# Patient Record
Sex: Female | Born: 1965 | Race: White | Hispanic: No | Marital: Married | Smoking: Current every day smoker
Health system: Southern US, Community
[De-identification: ages and names within clinical notes are randomized; demographics above are authoritative.]

## PROBLEM LIST (undated history)

## (undated) DIAGNOSIS — J449 Chronic obstructive pulmonary disease, unspecified: Secondary | ICD-10-CM

## (undated) DIAGNOSIS — M549 Dorsalgia, unspecified: Secondary | ICD-10-CM

## (undated) DIAGNOSIS — D3A090 Benign carcinoid tumor of the bronchus and lung: Secondary | ICD-10-CM

## (undated) DIAGNOSIS — R569 Unspecified convulsions: Secondary | ICD-10-CM

## (undated) DIAGNOSIS — G8929 Other chronic pain: Secondary | ICD-10-CM

## (undated) DIAGNOSIS — M797 Fibromyalgia: Secondary | ICD-10-CM

## (undated) HISTORY — PX: APPENDECTOMY: SHX54

## (undated) HISTORY — DX: Fibromyalgia: M79.7

## (undated) HISTORY — PX: OOPHORECTOMY: SHX86

## (undated) HISTORY — DX: Dorsalgia, unspecified: M54.9

## (undated) HISTORY — DX: Benign carcinoid tumor of the bronchus and lung: D3A.090

## (undated) HISTORY — DX: Chronic obstructive pulmonary disease, unspecified: J44.9

## (undated) HISTORY — DX: Unspecified convulsions: R56.9

## (undated) HISTORY — DX: Other chronic pain: G89.29

---

## 1997-09-09 ENCOUNTER — Ambulatory Visit (HOSPITAL_COMMUNITY): Admission: RE | Admit: 1997-09-09 | Discharge: 1997-09-09 | Payer: Self-pay | Admitting: Gastroenterology

## 1997-09-17 ENCOUNTER — Other Ambulatory Visit: Admission: RE | Admit: 1997-09-17 | Discharge: 1997-09-17 | Payer: Self-pay | Admitting: Gastroenterology

## 1997-09-22 ENCOUNTER — Ambulatory Visit (HOSPITAL_COMMUNITY): Admission: RE | Admit: 1997-09-22 | Discharge: 1997-09-22 | Payer: Self-pay | Admitting: Family Medicine

## 1997-10-12 ENCOUNTER — Ambulatory Visit (HOSPITAL_COMMUNITY): Admission: RE | Admit: 1997-10-12 | Discharge: 1997-10-12 | Payer: Self-pay | Admitting: Gastroenterology

## 1997-10-14 ENCOUNTER — Other Ambulatory Visit: Admission: RE | Admit: 1997-10-14 | Discharge: 1997-10-14 | Payer: Self-pay | Admitting: Gastroenterology

## 1997-10-22 ENCOUNTER — Ambulatory Visit (HOSPITAL_COMMUNITY): Admission: RE | Admit: 1997-10-22 | Discharge: 1997-10-22 | Payer: Self-pay | Admitting: Gastroenterology

## 1997-10-27 ENCOUNTER — Ambulatory Visit (HOSPITAL_COMMUNITY): Admission: RE | Admit: 1997-10-27 | Discharge: 1997-10-27 | Payer: Self-pay | Admitting: Gastroenterology

## 1997-10-29 ENCOUNTER — Ambulatory Visit (HOSPITAL_COMMUNITY): Admission: RE | Admit: 1997-10-29 | Discharge: 1997-10-29 | Payer: Self-pay | Admitting: Gastroenterology

## 1997-10-30 ENCOUNTER — Other Ambulatory Visit: Admission: RE | Admit: 1997-10-30 | Discharge: 1997-10-30 | Payer: Self-pay | Admitting: Gastroenterology

## 1997-11-05 ENCOUNTER — Encounter (HOSPITAL_COMMUNITY): Admission: RE | Admit: 1997-11-05 | Discharge: 1998-02-03 | Payer: Self-pay | Admitting: Gastroenterology

## 1997-11-20 ENCOUNTER — Ambulatory Visit (HOSPITAL_COMMUNITY): Admission: RE | Admit: 1997-11-20 | Discharge: 1997-11-20 | Payer: Self-pay | Admitting: Gastroenterology

## 1998-05-05 ENCOUNTER — Emergency Department (HOSPITAL_COMMUNITY): Admission: EM | Admit: 1998-05-05 | Discharge: 1998-05-05 | Payer: Self-pay | Admitting: Emergency Medicine

## 1998-05-06 ENCOUNTER — Encounter: Payer: Self-pay | Admitting: Emergency Medicine

## 1998-07-08 ENCOUNTER — Ambulatory Visit (HOSPITAL_COMMUNITY): Admission: RE | Admit: 1998-07-08 | Discharge: 1998-07-08 | Payer: Self-pay | Admitting: Orthopedic Surgery

## 1998-07-08 ENCOUNTER — Encounter: Payer: Self-pay | Admitting: Orthopedic Surgery

## 1998-07-26 ENCOUNTER — Encounter: Admission: RE | Admit: 1998-07-26 | Discharge: 1998-09-09 | Payer: Self-pay

## 2000-11-12 ENCOUNTER — Encounter: Payer: Self-pay | Admitting: Emergency Medicine

## 2000-11-12 ENCOUNTER — Emergency Department (HOSPITAL_COMMUNITY): Admission: EM | Admit: 2000-11-12 | Discharge: 2000-11-13 | Payer: Self-pay | Admitting: Emergency Medicine

## 2000-12-06 ENCOUNTER — Emergency Department (HOSPITAL_COMMUNITY): Admission: EM | Admit: 2000-12-06 | Discharge: 2000-12-06 | Payer: Self-pay | Admitting: Emergency Medicine

## 2001-03-27 ENCOUNTER — Encounter: Payer: Self-pay | Admitting: Emergency Medicine

## 2001-03-27 ENCOUNTER — Emergency Department (HOSPITAL_COMMUNITY): Admission: EM | Admit: 2001-03-27 | Discharge: 2001-03-27 | Payer: Self-pay | Admitting: Emergency Medicine

## 2001-09-09 ENCOUNTER — Emergency Department (HOSPITAL_COMMUNITY): Admission: EM | Admit: 2001-09-09 | Discharge: 2001-09-09 | Payer: Self-pay

## 2001-12-02 ENCOUNTER — Emergency Department (HOSPITAL_COMMUNITY): Admission: EM | Admit: 2001-12-02 | Discharge: 2001-12-02 | Payer: Self-pay | Admitting: *Deleted

## 2001-12-02 ENCOUNTER — Encounter: Payer: Self-pay | Admitting: *Deleted

## 2001-12-09 ENCOUNTER — Emergency Department (HOSPITAL_COMMUNITY): Admission: EM | Admit: 2001-12-09 | Discharge: 2001-12-09 | Payer: Self-pay | Admitting: Emergency Medicine

## 2002-03-31 ENCOUNTER — Emergency Department (HOSPITAL_COMMUNITY): Admission: EM | Admit: 2002-03-31 | Discharge: 2002-03-31 | Payer: Self-pay | Admitting: Emergency Medicine

## 2002-03-31 ENCOUNTER — Encounter: Payer: Self-pay | Admitting: Emergency Medicine

## 2002-04-09 ENCOUNTER — Emergency Department (HOSPITAL_COMMUNITY): Admission: EM | Admit: 2002-04-09 | Discharge: 2002-04-09 | Payer: Self-pay | Admitting: Emergency Medicine

## 2002-04-09 ENCOUNTER — Encounter: Payer: Self-pay | Admitting: Emergency Medicine

## 2002-04-16 ENCOUNTER — Emergency Department (HOSPITAL_COMMUNITY): Admission: EM | Admit: 2002-04-16 | Discharge: 2002-04-16 | Payer: Self-pay | Admitting: Emergency Medicine

## 2002-06-19 ENCOUNTER — Emergency Department (HOSPITAL_COMMUNITY): Admission: EM | Admit: 2002-06-19 | Discharge: 2002-06-19 | Payer: Self-pay | Admitting: Emergency Medicine

## 2002-06-24 ENCOUNTER — Emergency Department (HOSPITAL_COMMUNITY): Admission: EM | Admit: 2002-06-24 | Discharge: 2002-06-24 | Payer: Self-pay | Admitting: Emergency Medicine

## 2002-06-25 ENCOUNTER — Emergency Department (HOSPITAL_COMMUNITY): Admission: EM | Admit: 2002-06-25 | Discharge: 2002-06-25 | Payer: Self-pay | Admitting: Emergency Medicine

## 2002-06-29 ENCOUNTER — Emergency Department (HOSPITAL_COMMUNITY): Admission: EM | Admit: 2002-06-29 | Discharge: 2002-06-29 | Payer: Self-pay | Admitting: Emergency Medicine

## 2002-07-09 ENCOUNTER — Emergency Department (HOSPITAL_COMMUNITY): Admission: EM | Admit: 2002-07-09 | Discharge: 2002-07-09 | Payer: Self-pay | Admitting: Emergency Medicine

## 2002-07-09 ENCOUNTER — Encounter: Payer: Self-pay | Admitting: Emergency Medicine

## 2002-09-17 ENCOUNTER — Emergency Department (HOSPITAL_COMMUNITY): Admission: EM | Admit: 2002-09-17 | Discharge: 2002-09-17 | Payer: Self-pay | Admitting: Emergency Medicine

## 2002-11-04 ENCOUNTER — Encounter: Payer: Self-pay | Admitting: Emergency Medicine

## 2002-11-04 ENCOUNTER — Emergency Department (HOSPITAL_COMMUNITY): Admission: EM | Admit: 2002-11-04 | Discharge: 2002-11-04 | Payer: Self-pay | Admitting: Emergency Medicine

## 2002-11-08 ENCOUNTER — Emergency Department (HOSPITAL_COMMUNITY): Admission: EM | Admit: 2002-11-08 | Discharge: 2002-11-08 | Payer: Self-pay | Admitting: Emergency Medicine

## 2002-11-08 ENCOUNTER — Encounter: Payer: Self-pay | Admitting: Emergency Medicine

## 2002-12-01 ENCOUNTER — Emergency Department (HOSPITAL_COMMUNITY): Admission: EM | Admit: 2002-12-01 | Discharge: 2002-12-01 | Payer: Self-pay | Admitting: Emergency Medicine

## 2003-01-14 ENCOUNTER — Emergency Department (HOSPITAL_COMMUNITY): Admission: EM | Admit: 2003-01-14 | Discharge: 2003-01-14 | Payer: Self-pay | Admitting: Emergency Medicine

## 2003-01-17 ENCOUNTER — Emergency Department (HOSPITAL_COMMUNITY): Admission: AD | Admit: 2003-01-17 | Discharge: 2003-01-17 | Payer: Self-pay | Admitting: Family Medicine

## 2003-02-27 ENCOUNTER — Emergency Department (HOSPITAL_COMMUNITY): Admission: EM | Admit: 2003-02-27 | Discharge: 2003-02-27 | Payer: Self-pay | Admitting: Emergency Medicine

## 2003-03-09 ENCOUNTER — Emergency Department (HOSPITAL_COMMUNITY): Admission: EM | Admit: 2003-03-09 | Discharge: 2003-03-09 | Payer: Self-pay | Admitting: Emergency Medicine

## 2003-03-16 ENCOUNTER — Emergency Department (HOSPITAL_COMMUNITY): Admission: AD | Admit: 2003-03-16 | Discharge: 2003-03-16 | Payer: Self-pay | Admitting: Family Medicine

## 2003-04-16 ENCOUNTER — Emergency Department (HOSPITAL_COMMUNITY): Admission: EM | Admit: 2003-04-16 | Discharge: 2003-04-16 | Payer: Self-pay | Admitting: Emergency Medicine

## 2003-04-18 ENCOUNTER — Emergency Department (HOSPITAL_COMMUNITY): Admission: EM | Admit: 2003-04-18 | Discharge: 2003-04-18 | Payer: Self-pay | Admitting: Emergency Medicine

## 2003-05-11 ENCOUNTER — Emergency Department (HOSPITAL_COMMUNITY): Admission: AD | Admit: 2003-05-11 | Discharge: 2003-05-11 | Payer: Self-pay | Admitting: Family Medicine

## 2003-10-01 ENCOUNTER — Emergency Department (HOSPITAL_COMMUNITY): Admission: EM | Admit: 2003-10-01 | Discharge: 2003-10-01 | Payer: Self-pay | Admitting: Emergency Medicine

## 2003-12-02 ENCOUNTER — Emergency Department (HOSPITAL_COMMUNITY): Admission: EM | Admit: 2003-12-02 | Discharge: 2003-12-02 | Payer: Self-pay | Admitting: Family Medicine

## 2004-01-22 ENCOUNTER — Emergency Department (HOSPITAL_COMMUNITY): Admission: EM | Admit: 2004-01-22 | Discharge: 2004-01-22 | Payer: Self-pay | Admitting: Emergency Medicine

## 2004-02-21 ENCOUNTER — Emergency Department: Payer: Self-pay | Admitting: Unknown Physician Specialty

## 2004-06-21 ENCOUNTER — Emergency Department: Payer: Self-pay | Admitting: Emergency Medicine

## 2004-07-18 ENCOUNTER — Emergency Department: Payer: Self-pay | Admitting: Emergency Medicine

## 2004-11-26 ENCOUNTER — Emergency Department (HOSPITAL_COMMUNITY): Admission: EM | Admit: 2004-11-26 | Discharge: 2004-11-26 | Payer: Self-pay | Admitting: Emergency Medicine

## 2005-01-03 ENCOUNTER — Emergency Department (HOSPITAL_COMMUNITY): Admission: EM | Admit: 2005-01-03 | Discharge: 2005-01-03 | Payer: Self-pay | Admitting: Emergency Medicine

## 2005-01-05 ENCOUNTER — Emergency Department (HOSPITAL_COMMUNITY): Admission: EM | Admit: 2005-01-05 | Discharge: 2005-01-05 | Payer: Self-pay | Admitting: *Deleted

## 2005-01-08 ENCOUNTER — Emergency Department (HOSPITAL_COMMUNITY): Admission: EM | Admit: 2005-01-08 | Discharge: 2005-01-08 | Payer: Self-pay | Admitting: Family Medicine

## 2005-01-10 ENCOUNTER — Emergency Department (HOSPITAL_COMMUNITY): Admission: EM | Admit: 2005-01-10 | Discharge: 2005-01-10 | Payer: Self-pay | Admitting: Emergency Medicine

## 2005-01-17 ENCOUNTER — Emergency Department (HOSPITAL_COMMUNITY): Admission: EM | Admit: 2005-01-17 | Discharge: 2005-01-17 | Payer: Self-pay | Admitting: Emergency Medicine

## 2007-05-05 ENCOUNTER — Emergency Department (HOSPITAL_COMMUNITY): Admission: EM | Admit: 2007-05-05 | Discharge: 2007-05-05 | Payer: Self-pay | Admitting: Emergency Medicine

## 2007-06-20 ENCOUNTER — Emergency Department (HOSPITAL_COMMUNITY): Admission: EM | Admit: 2007-06-20 | Discharge: 2007-06-20 | Payer: Self-pay | Admitting: Emergency Medicine

## 2007-10-07 ENCOUNTER — Encounter (INDEPENDENT_AMBULATORY_CARE_PROVIDER_SITE_OTHER): Payer: Self-pay | Admitting: Family Medicine

## 2007-10-07 ENCOUNTER — Ambulatory Visit: Payer: Self-pay | Admitting: Internal Medicine

## 2007-10-07 LAB — CONVERTED CEMR LAB
ALT: 14 units/L (ref 0–35)
AST: 19 units/L (ref 0–37)
Albumin: 3.9 g/dL (ref 3.5–5.2)
Alkaline Phosphatase: 69 units/L (ref 39–117)
BUN: 13 mg/dL (ref 6–23)
Basophils Absolute: 0 10*3/uL (ref 0.0–0.1)
Basophils Relative: 0 % (ref 0–1)
CO2: 19 meq/L (ref 19–32)
Calcium: 8.8 mg/dL (ref 8.4–10.5)
Chloride: 105 meq/L (ref 96–112)
Creatinine, Ser: 0.66 mg/dL (ref 0.40–1.20)
Eosinophils Absolute: 0.3 10*3/uL (ref 0.0–0.7)
Eosinophils Relative: 3 % (ref 0–5)
Glucose, Bld: 128 mg/dL — ABNORMAL HIGH (ref 70–99)
HCT: 39.1 % (ref 36.0–46.0)
Hemoglobin: 12.7 g/dL (ref 12.0–15.0)
Lymphocytes Relative: 21 % (ref 12–46)
Lymphs Abs: 2.6 10*3/uL (ref 0.7–4.0)
MCHC: 32.5 g/dL (ref 30.0–36.0)
MCV: 94 fL (ref 78.0–100.0)
Monocytes Absolute: 1 10*3/uL (ref 0.1–1.0)
Monocytes Relative: 8 % (ref 3–12)
Neutro Abs: 8.2 10*3/uL — ABNORMAL HIGH (ref 1.7–7.7)
Neutrophils Relative %: 68 % (ref 43–77)
Platelets: 350 10*3/uL (ref 150–400)
Potassium: 3.8 meq/L (ref 3.5–5.3)
RBC: 4.16 M/uL (ref 3.87–5.11)
RDW: 13.6 % (ref 11.5–15.5)
Sodium: 135 meq/L (ref 135–145)
Total Bilirubin: 0.3 mg/dL (ref 0.3–1.2)
Total Protein: 7 g/dL (ref 6.0–8.3)
WBC: 12.1 10*3/uL — ABNORMAL HIGH (ref 4.0–10.5)

## 2007-10-08 ENCOUNTER — Ambulatory Visit: Payer: Self-pay | Admitting: *Deleted

## 2007-10-11 ENCOUNTER — Encounter (INDEPENDENT_AMBULATORY_CARE_PROVIDER_SITE_OTHER): Payer: Self-pay | Admitting: Family Medicine

## 2007-10-11 LAB — CONVERTED CEMR LAB: Hgb A1c MFr Bld: 5.8 % (ref 4.6–6.1)

## 2007-10-12 ENCOUNTER — Emergency Department (HOSPITAL_COMMUNITY): Admission: EM | Admit: 2007-10-12 | Discharge: 2007-10-12 | Payer: Self-pay | Admitting: Emergency Medicine

## 2007-10-17 ENCOUNTER — Ambulatory Visit: Payer: Self-pay | Admitting: Internal Medicine

## 2008-04-06 ENCOUNTER — Emergency Department (HOSPITAL_COMMUNITY): Admission: EM | Admit: 2008-04-06 | Discharge: 2008-04-07 | Payer: Self-pay | Admitting: Emergency Medicine

## 2009-02-23 ENCOUNTER — Ambulatory Visit: Payer: Self-pay | Admitting: Thoracic Surgery (Cardiothoracic Vascular Surgery)

## 2009-08-25 ENCOUNTER — Ambulatory Visit: Payer: Self-pay | Admitting: Thoracic Surgery (Cardiothoracic Vascular Surgery)

## 2009-09-14 ENCOUNTER — Ambulatory Visit (HOSPITAL_COMMUNITY): Admission: RE | Admit: 2009-09-14 | Discharge: 2009-09-14 | Payer: Self-pay | Admitting: Thoracic Surgery

## 2009-10-20 ENCOUNTER — Ambulatory Visit: Payer: Self-pay | Admitting: Thoracic Surgery (Cardiothoracic Vascular Surgery)

## 2010-02-07 ENCOUNTER — Ambulatory Visit: Payer: Self-pay | Admitting: Thoracic Surgery (Cardiothoracic Vascular Surgery)

## 2010-02-07 ENCOUNTER — Encounter
Admission: RE | Admit: 2010-02-07 | Discharge: 2010-02-07 | Payer: Self-pay | Admitting: Thoracic Surgery (Cardiothoracic Vascular Surgery)

## 2010-06-20 LAB — GLUCOSE, CAPILLARY: Glucose-Capillary: 98 mg/dL (ref 70–99)

## 2010-07-08 ENCOUNTER — Other Ambulatory Visit: Payer: Self-pay | Admitting: Thoracic Surgery (Cardiothoracic Vascular Surgery)

## 2010-07-08 DIAGNOSIS — R911 Solitary pulmonary nodule: Secondary | ICD-10-CM

## 2010-08-16 ENCOUNTER — Ambulatory Visit: Payer: Self-pay | Admitting: Thoracic Surgery (Cardiothoracic Vascular Surgery)

## 2010-08-16 ENCOUNTER — Ambulatory Visit
Admission: RE | Admit: 2010-08-16 | Discharge: 2010-08-16 | Disposition: A | Discharge status: Home / Self Care | Payer: Medicaid Other | Source: Ambulatory Visit | Attending: Thoracic Surgery (Cardiothoracic Vascular Surgery) | Admitting: Thoracic Surgery (Cardiothoracic Vascular Surgery)

## 2010-08-16 DIAGNOSIS — R911 Solitary pulmonary nodule: Secondary | ICD-10-CM

## 2010-08-16 NOTE — Assessment & Plan Note (Signed)
OFFICE VISIT   Amanda Clark, Amanda Clark  DOB:  05/22/1965                                        Aug 25, 2009  CHART #:  [id number]   REASON FOR VISIT:  Followup regarding right lower lobe lung nodule.   HISTORY OF PRESENT ILLNESS:  The patient is a 45 year old woman with  multiple medical problems including tobacco abuse and severe COPD.  She  has been followed for over 18 months now for a right lower lobe mass.  I  saw her in November 2010 for the first time.  She missed multiple  followup appointments since that time.  This nodule was being evaluated.  She had a needle biopsy, which showed inflammation consistent with  inflammatory pseudotumor.  A PET scan was done, which showed mild uptake  with SUV of 3 and was felt that this still could represent a malignancy.  She was referred to Dr. Lynetta Mare for consideration of surgical  resection.  She showed on the day of surgery intoxicated and was  discharged from his practice and then was referred to me.  She states  that she has missed multiple followup appointments because of financial  issues.  Her husband is a Designer, fashion/clothing and has not had much work.  She has  also been in and out of the hospital multiple times with pulmonary  issues and bronchitis.   PAST MEDICAL HISTORY:  Significant for:  1. Right lung mass.  2. Bipolar disorder.  3. COPD.  4. Fibromyalgia.  5. Seizures.  6. Mitral valve prolapse.  7. TMJ.  8. Chronic back pain.  9. Bronchitis.  10.Pneumonia.   PAST SURGICAL HISTORY:  Appendectomy, cesarean section, and  oophorectomy.   CURRENT MEDICATIONS:  1. Seroquel 600 mg nightly.  2. Depakote 500 mg tablets, 5 tablets nightly.  3. Klonopin 2-3 tablets daily p.r.n.  4. Azor.  5. Flexeril p.r.n.  6. Vicodin p.r.n.  7. Tramadol p.r.n.   ALLERGIES:  She has allergies to morphine, which causes itching and  steroids, which causes agitation.   FAMILY HISTORY:  Significant for premature cardiac  disease in both  mother and father.   SOCIAL HISTORY:  She is married.  She is accompanied by her husband.  She says she is unable to work, has been unable to get disability or  Medicaid.  She smokes about 3 cigarettes daily.  She says 1 in the  morning, 1 in the afternoon, and 1 in the evening before she goes to  bed.  She is denying any alcohol or illicit drug use.   REVIEW OF SYSTEMS:  Loss of appetite, chest pain, chest tightness,  shortness of breath when lying flat, palpitations, shortness of breath  with exertion, dizziness, syncopal episodes, depression, and  nervousness.  All other systems are negative.   PHYSICAL EXAMINATION:  General:  The patient is a disheveled 45 year old  female, in no acute distress.  She is very anxious.  Vital Signs:  Her  blood pressure is 148/99, pulse 101, respirations are 18, and oxygen  saturation is 96% on room air.  Neurological:  She is alert and oriented  x3 with no focal deficits.  HEENT:  Poor dentition.  She does smell of  tobacco smoke.  Neck:  She has no cervical adenopathy, but there is a  small palpable node in the  left supraclavicular region.  Lungs:  Diminished breath sounds bilaterally.  There is no wheezing at the  present time.  Cardiac:  Regular rate and rhythm.  Normal S1 and S2.  There are no rubs or murmurs.  Abdomen:  Soft and nontender.  Extremities:  Without clubbing, cyanosis, or edema.   LABORATORY DATA:  A chest x-ray from [facility] in March of this year  does not show the mass.  However, it is relatively low in the right  lower lobe and it could be obscured by the diaphragm.  She says that she  had a dobutamine echo done by Dr. Melina Modena in January, but we do not  have the report of that.  Pulmonary function testing from [facility] in  December 2010 showed an FEV1 of 1.06 was decreased to 0.93, post  bronchodilators, her FVC was 1.5, which is 47% predicted.  FEV1 to FVC  ratio was 72%.  MVV was 104.  Blood gas  showed a pH of 7.45, pCO2 of 36,  and pO2 of 70.   IMPRESSION:  The patient is a 45 year old woman with right lower lobe  mass, severe COPD, and a history of tobacco abuse.  A needle biopsy  showed inflammation.  This is mildly positive on PET, which could also  represent inflammation.  Her new finding today is that she has a left  supraclavicular node.  Given this findings, she needs a repeat PET/CT to  see if this node in her neck lights up there is any other signs of  adenopathy or increased metabolic activity.  It also give [place] some  information as to the size of the primary mass, which instantly really  is visible on the plain chest x-ray, but again could be obscured by the  diaphragm given this is relatively low in the right lower lobe.  She is  a very poor surgical candidate.  The recommendation as to surgical  biopsy would await the results of this PET/CT as well as obtain reports  from Dr. Thomasena Edis who she saw recently and Dr. Melina Modena who she saw last  week and who did a dobutamine echo on her earlier in the year.  We will  go ahead and order the PET/CT, and I will plan to see her back in 2  weeks to follow up on that result.  Hopefully, we will have the other  information by that time.   Salvatore Decent Dorris Fetch, M.D.  Electronically Signed   SCH/MEDQ  D:  08/25/2009  T:  08/26/2009  Job:  [id number]   cc:   Joycie Peek  Karyl Kinnier

## 2010-08-16 NOTE — Consult Note (Signed)
NEW PATIENT CONSULTATION   Amanda Clark, Amanda Clark  DOB:  October 14, 1965                                        February 23, 2009  CHART #:  [id number]   REASON FOR CONSULTATION:  Right lower lobe mass.   HISTORY OF PRESENT ILLNESS:  The patient is a 45 year old woman who a  year ago was found to have a right lower lobe mass.  She has a history  of tobacco abuse.  A needle biopsy was done at that time which showed  inflammation consistent with inflammatory pseudotumor.  She was referred  to Dr. Lynetta Mare for consideration for surgical resection given that  the mass was suspicious for malignancy based on the CT findings.  Apparently she went through workup, but missed multiple appointments  with him and then showed up on the day of surgery, intoxicated.  She was  advised to undergo substance abuse counseling.  She subsequently had a  head CT in March of this year, which apparently was done in [facility], which showed increased uptake in this lesion with a SUV of 3.  There also was some increased activity in the distal esophagus.   She had repeat CT scans in both July and September both of which showed  no change in the 2 cm diameter right lower lobe mass.  She now is  referred for consideration for surgical resection.   She states that she has had a cough with scant sputum, which has been  clear.  There is no hemoptysis.  She does have wheezing.  She says that  she also suffers from chest pain, which she describes as a tightness and  shortness of breath.  Two to three times a month she will have an  episode where she has significant chest pain, shortness of breath, and  will break out into a sweat.  She says she feels panic during these  episodes.  She has been diagnosed with mitral valve prolapse.  She also  says that at least weekly and sometimes more commonly she has dizziness  or blackouts.  She has not had any weight loss.  She is still smoking  half a pack  of cigarettes daily.   PAST MEDICAL HISTORY:  Significant for,  1. Right lung mass.  2. Bipolar disorder.  3. COPD.  4. Fibromyalgias.  5. Seizures.  6. Mitral valve prolapse.  7. TMJ.  8. Chronic back pain.   PAST SURGICAL HISTORY:  Appendectomy, cesarean section, and  oophorectomy.   CURRENT MEDICATIONS:  1. Quetiapine fumarate 200 mg at bedtime.  2. Clonazepam 1 mg p.o. t.i.d.  3. Lasix 20 mg daily.  4. Potassium daily.  5. Topamax 50 mg daily.  6. She also takes diphenhydramine, promethazine, propoxyphene, and      ibuprofen p.r.n.   ALLERGIES:  She has allergies to morphine, which causes nausea,  vomiting, itching, and steroids, which when she is on for long-term  cause agitation.   FAMILY HISTORY:  Significant for premature heart disease in both mother  and father.   SOCIAL HISTORY:  She is married.  She is disabled.  She is smoking half  a pack of cigarettes daily.  She denies alcohol and illicit drug use.   REVIEW OF SYSTEMS:  Patient medical history form is reviewed and is on  the chart.  She notes loss of appetite, chest pain, chest tightness,  shortness of breath when lying flat, shortness of breath with exertion,  palpitations, COPD, cough, ulcers, dizziness, blackouts, headaches.  She  does have seizure history, arthritis, joint pain, muscle pain,  depression, nervousness, recent change in eyesight, peripheral edema.  All other systems are negative.   PHYSICAL EXAMINATION:  General:  The patient is a 45 year old woman who  is poorly kempt.  General:  She has mildly disheveled appearance.  She  is in no acute distress.  Vital Signs:  Blood pressure 156/101, pulse  113, respirations of 20, oxygen saturation 95% on room air.  Neurologic:  She is alert and oriented and appropriate at the present time with no  focal deficits.  HEENT:  Unremarkable.  Neck:  There is no thyromegaly,  adenopathy, or bruits.  Cardiac:  Regular rate and rhythm.  S1 and S2.  There  is no murmur.  Lungs:  Mild congestion, but otherwise clear  bilaterally.  There is no wheezing.  Extremities:  Without clubbing or  cyanosis.  She has trace edema in her lower extremities.   CT scan from September is reviewed and compared to the scan from July,  there was no interval change in the 2-cm right lower lobe mass.  No  mediastinal or hilar adenopathy.   IMPRESSION:  The patient is a 45 year old woman with history of bipolar  disease and tobacco abuse who has been followed with a lung mass for a  little over a year now.  She was at one point scheduled to have surgery,  but there is some discrepancy between her history which states that she  showed up for surgery intoxicated and was referred for substance abuse  counseling versus what she is telling me today which is that she became  ill, went to a different hospital and then just did not want to re-  schedule at that time because of financial concerns.   Was seen primarily a year ago, but at that point said that this mass  needs to come out.  The question is now after a year with no change and  no evidence of spread as well as a needle biopsy, which shows  inflammation, whether this mass could be safely followed or would need  to be removed.  Discussed the issues related to that with her and her  father-in-law who is present.  They understand that the needle biopsy is  not 100% accurate, there could be a relatively small focus of cancer  within this inflammatory mass.  This needle biopsy might have missed, so  cancer cannot be ruled out strictly based on needle biopsy.  I do think  the one thing in the favor that this will be more benign would be that  it really has not changed significantly in size, but again if there was  relatively small component of cancer it could be growing with the  increase in the overall size of the mass.  I offered her the option of  surgical resection versus continued following, but did encourage   surgical resection, which she says she does wish to proceed with.   However, there are multiple issues that need to be addressed first of  all her most recent CT scan is 10 weeks ago that needs to be repeated.  Secondly, she needs preoperative cardiology clearance given her frequent  episodes of chest pain, shortness of breath, and diaphoresis.  This may  all be due to  panic attacks or mitral valve prolapse, but she does have  a family history of coronary disease and I do not think it can be ruled  out based on her history.  We will try to set her up for a cardiology  evaluation done in [place].  She also needs pulmonary function testing  in room air blood gas.  We will go ahead and schedule those.  Finally,  it is absolutely vital that she quit smoking prior to surgery.  There is  no reason particularly after a 38-month delay to rush in to surgery  while she is still smoking half a pack a day, which would increase her  risk for perioperative complications.  I discussed these issues in  detail with her and told her that we had to take care of all four of  these issues prior to surgical consideration.  We will reschedule her  for a followup visit in 2 weeks and we will have some of that  accomplished by then, although we will need to have her stop smoking for  a longer period of time before surgery.   Salvatore Decent Dorris Fetch, M.D.  Electronically Signed   SCH/MEDQ  D:  02/23/2009  T:  02/24/2009  Job:  [id number]   cc:   Joycie Peek, MD  Karyl Kinnier

## 2010-08-16 NOTE — Assessment & Plan Note (Signed)
OFFICE VISIT   BREELEY, BISCHOF  DOB:  1965/12/11                                        February 07, 2010  CHART #:  [id number]   REASON FOR VISIT:  Follow up of right lower lobe nodule.   HISTORY:  The patient is a 45 year old woman who has a right lower lobe  mass.  This was initially found back in 2009.  She saw Dr. Joycie Peek at [place].  A needle biopsy showed inflammation.  A PET scan  showed a 2-cm mass with an SUV of 3 given concern that the yield from  the needle biopsy is not 100%.  She had been referred to Dr. Lynetta Mare.  She was scheduled for surgery but did not show up on multiple  occasions and she was released from their practice.  I have been seeing  her since November 2010.  I initially recommended surgery, but she  needed cardiology clearance.  She also was continuing to smoke.  She  missed multiple followup appointments and subsequently I saw her back in  May.  At that time, her pulmonary function testing was dramatically  impaired with an FEV-1 of 1.06 which decreased to 0.93 bronchodilators.  FVC was 47% of predicted.  We did a PET-CT at that time and it showed  similar findings to their previous PET-CT, the nodule was not increased  in size.  The SUV was actually slightly lower, probably within the range  of error, but at that time she was continuing to smoke, was very  disheveled.  She was having increasing wheezing, cough, sputum  production, and shortness of breath and was really not a surgical  candidate at all at that point in time.  By that time, it had also been  21 months since the nodule had been originally found.  I recommended to  her that we wait and see if she could stop smoking and then repeat her  scans at this time, which is now 2 years from her original discovery of  the lung nodule.  She returns today.  She says she has quit smoking.  She of note has continued to have a persistent cough.  She is on Spiriva  and also uses albuterol as a rescue inhaler.  She says that her  breathing has been fully improved since I saw her in July; however, she  still says that she will get up and wash a couple of dishes and have to  sit down and rest because of shortness of breath.   CURRENT MEDICATIONS:  1. Seroquel at bedtime.  2. Lasix 20 mg daily.  3. Spiriva inhaler 18 mcg daily.  4. Depakote 2500 mg at bedtime.  5. Klonopin 2-3 tablets daily p.r.n.  6. Albuterol p.r.n.  7. Azor.   ALLERGIES:  She has allergies to morphine and steroids.   PAST MEDICAL HISTORY:  Unchanged.   FAMILY AND SOCIAL HISTORY:  Unchanged except for discontinuance of  smoking.   REVIEW OF SYSTEMS:  See HPI.   PHYSICAL EXAMINATION:  The patient appears to be in somewhat better  shape than she was during her last visit.  She is a 45 year old woman in  no acute distress.  Blood pressure is 148/93, pulse 100, respirations  are 20, her oxygen saturation is 96% on room air.  Her lungs have  diminished breath sounds bilaterally.  There are faint expiratory  wheezes bilaterally.  There is no cervical or supraclavicular  adenopathy.  Cardiac exam has a regular rate and rhythm.  Normal S1 and  S2 with no rubs or murmurs.   LABORATORY DATA:  CT scan was performed that shows a 15- x 18-mm nodule  at the right base.  This is unchanged from her CT scan which was done in  conjunction with a PET scan and also is no larger than this nodule was 2  years ago when originally found.   IMPRESSION:  Ms. Printz is a 45 year old woman with a history of tobacco  abuse and a right lower lung nodule.  This has been biopsied about a  year ago and showed inflammation but no malignancy.  At one point, we  are going to try to resect this anyway just to eliminate the possibility  that there was a cancer with an inflammatory reaction around it;  however, we were unable to ever get her in good enough shape to tolerate  a surgical resection.  At this  point now, it has been 2 years and this  nodule really is unchanged.  She has had 2 PET scans about a year apart  which showed no difference, so at this point I would not recommend  surgical intervention given that she is still very significant risk with  marginal pulmonary status.  I recommended to her that we would repeat a  CT scan in 6 months.  If the nodule were to show growth in that period  of time, then repeat pulmonary function testing and consideration for  surgery would be appropriate.   Salvatore Decent Dorris Fetch, M.D.  Electronically Signed   SCH/MEDQ  D:  02/07/2010  T:  02/08/2010  Job:  [id number]   cc:   Joycie Peek  Karyl Kinnier

## 2010-08-16 NOTE — Letter (Signed)
February 23, 2009   Reine Just, MD  [address]  [place], [place]  [address]   Re:  Amanda Clark, Amanda Clark               DOB:  July 16, 1965   Dear Dr. Thomasena Edis:   Thank you very much for sending the patient for evaluation.  As you  know, she is a somewhat noncompliant patient who has had a right lung  mass which was first found in 2009.  She at one point apparently was  scheduled with surgery with Dr. Para March, but the procedure had to be  cancelled and then she never rescheduled.  She has a roughly 2-cm mass  based on her most recent scan which was in early September of this year  about 10 weeks ago.  There is no evidence of mediastinal or hilar  adenopathy.  She has had a needle biopsy done about a year ago which  showed inflammation consistent with inflammatory pseudotumor.   I had a long discussion with the patient.  This may well in fact just be  an inflammatory lesion but I did discuss with her the possibility that  there could be a relatively small underlying cancer with an inflammatory  process and that could not be completely ruled out by needle biopsy. And  I recommended to her that we proceed with surgical resection; however,  there are several issues that need to be addressed prior to that.   I feel like we should repeat her CT scan as it has been approximately 10  weeks since her most recent one.  We will go ahead and schedule that.   I am going to have her see a cardiologist because she has frequent  episodes of chest pain and shortness of breath.  This may be related to  anxiety or mitral valve prolapse, but she does have a family history of  coronary disease.  I am also going to schedule her for pulmonary  function testing, and finally I think it is vitally important that she  stop smoking prior to surgery.  There has already been a roughly 13-  month delay in surgical resection.  I do not think that waiting for her  to stop smoking will adversely affect her  outcome.  I will plan to see  her back in a couple of weeks and keep you informed of her progress.   Salvatore Decent Dorris Fetch, M.D.  Electronically Signed   SCH/MEDQ  D:  02/23/2009  T:  02/24/2009  Job:  [id number]

## 2010-08-16 NOTE — Assessment & Plan Note (Signed)
[facility] VISIT   Amanda, [name]  DOB:  17-Dec-1965                                        October 20, 2009  CHART #:  [id number]   REASON FOR FOLLOWUP:  Follow-up of right lower lobe lung nodule.  Ms.  Amanda Clark is a 45 year old woman with a history of heavy tobacco abuse and  severe COPD with an FEV-1 of 1, which actually decreases to 0.93 with  bronchodilators.  I have been following her since November 2010  regarding a right lower lobe mass.  She was seen originally by Dr.  Joycie Peek in [place].  A needle biopsy showed inflammation but a  PET scan showed a 2-cm mass with an SUV of 3, concerning for possible  low-grade primary bronchogenic cancer.  She was referred to Dr. Lynetta Mare for consideration of surgery.  She did not have that surgery done  at that time, then subsequently  was referred to me.  Since that time I  have seen her on multiple occasions.  She also has missed numerous  appointments.  She has significant financial issues.  She has applied  for Medicaid but has not yet been approved.  I last saw her in May of  this year at which time she had some supraclavicular node that was  palpable and I recommended that she have a PET CT done.  That was done  at [facility] on June 14.  It showed a 1.5-cm pulmonary nodule with an  SUV max of 2.4.  Again this was read as being suspicious for low-grade  bronchogenic cancer.   [name] states that her breathing has been very poor in recent weeks.  She always has trouble with the heat and humidity but she says she has  been having increasing frequency of cough, more sputum production, more  wheezing and more shortness of breath.  Her activities have been  essentially the minimal activities of daily living.  She says that she  has been smoking but does smell of tobacco smoke, says her husband  continues to smoke.   PAST MEDICAL HISTORY:  Significant for right lower lobe lung mass,  bipolar  disorder, severe emphysema, fibromyalgia, seizures, mitral valve  prolapse, TMJ syndrome, chronic back pain, bronchitis and pneumonia,  previous appendectomy, cesarean section and oophorectomy.   CURRENT MEDICATIONS:  1. Seroquel 600 mg nightly.  2. Depakote 500 mg 5 tablets nightly.  3. Klonopin 2-3 tablets daily p.r.n.  4. Azor.  5. Flexeril,  6. Vicodin.  7. Tramadol.   ALLERGIES:  She has an allergy to MORPHINE and STEROIDS.   REVIEW OF SYSTEMS:  See HPI.  Recent exacerbation of COPD.   PHYSICAL EXAMINATION:  Amanda Clark is a disheveled, 45 year old woman, in  no acute distress.  She is anxious.  LUNGS:  Have diminished breath sounds bilaterally.  There is no active  wheezing at present.  LYMPH:  There is no palpable cervical, supraclavicular, or axillary  adenopathy.   The CT from [facility] is reviewed.  There is a 1.5-cm right lower lobe  nodule which does have some increased metabolic activity with an SUV of  2.4.   IMPRESSION:  Amanda Clark is a very difficult 45 year old patient.  She  has missed multiple appointments and that has caused care to be delayed  out  over a very long period of time.  She first was found to have a  pulmonary nodule in the late fall/early winter of 2009.  She had gone  through the physicians at [facility] and had a needle biopsy which showed  inflammation, consistent with inflammatory pseudotumor.  However, this  mass persisted and Dr. Thomasena Edis referred her to Dr. Para March, who set her  up for surgery, but then she showed up intoxicated and the surgery was  cancelled and she was subsequently released from that practice and  showed up in our office almost a year ago.  That was about a year after  this lesion had first been found.  When I saw her in May, I was  concerned she might have a supraclavicular node.  It turned out that was  negative and did not light up on PET and now has resolved.  However, of  note, compared to her original PET scan,  which I am not sure the readers  at [facility] had, the mass is 1.5 cm, as opposed to 2, although that  could be of just some inter-observer variability, and the SUV is 2.4, as  compared to 3, which again may just be inter-observer variability, but  it certainly is not increased in a period of 21 months.  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  Again, there is no way to absolutely rule out that there is a small  focus of bronchogenic cancer within it.  However, I would expect to see  some growth even from a very low-grade malignancy over this period of  time.   At this point, she is not a candidate for surgery.  Her appearance is  not someone who would tolerate a major operation.  Her pulmonary  function was marginal when tested under much more ideal circumstances  and currently is having an exacerbation of her COPD.  My recommendation  to her at this time is to wait additional 3 months and do a repeat CT  scan, which can then be compared to her original scans with a 2-year  interval and, if there has been no growth at that time, I would be  comfortable that this is a benign lesion and not malignant.  I think, as  I told her, that there is some slight risk that this is malignancy,  could grow or metastasize in the interim, but it has not done so in the  past 21 months, it is very unlikely to in the next 3 months, and that  her risk for surgery is prohibitive at the current time.   I will plan to see her back in 3 months with a CT scan.   Salvatore Decent Dorris Fetch, M.D.  Electronically Signed   SCH/MEDQ  D:  10/20/2009  T:  10/20/2009  Job:  [id number]

## 2010-08-19 ENCOUNTER — Ambulatory Visit: Payer: Self-pay | Admitting: Thoracic Surgery (Cardiothoracic Vascular Surgery)

## 2010-08-23 ENCOUNTER — Other Ambulatory Visit: Payer: Self-pay

## 2010-08-23 ENCOUNTER — Ambulatory Visit: Payer: Self-pay | Admitting: Thoracic Surgery (Cardiothoracic Vascular Surgery)

## 2010-08-30 ENCOUNTER — Ambulatory Visit: Payer: Medicaid Other | Admitting: Thoracic Surgery (Cardiothoracic Vascular Surgery)

## 2010-09-06 ENCOUNTER — Ambulatory Visit: Payer: Medicaid Other | Admitting: Thoracic Surgery (Cardiothoracic Vascular Surgery)

## 2010-10-06 ENCOUNTER — Ambulatory Visit: Payer: Medicaid Other | Admitting: Thoracic Surgery (Cardiothoracic Vascular Surgery)

## 2010-11-14 ENCOUNTER — Ambulatory Visit: Payer: Medicaid Other | Admitting: Thoracic Surgery (Cardiothoracic Vascular Surgery)

## 2010-11-15 ENCOUNTER — Other Ambulatory Visit: Payer: Self-pay | Admitting: Thoracic Surgery (Cardiothoracic Vascular Surgery)

## 2010-11-15 DIAGNOSIS — R911 Solitary pulmonary nodule: Secondary | ICD-10-CM

## 2010-11-21 ENCOUNTER — Other Ambulatory Visit: Payer: Medicaid Other

## 2010-11-21 ENCOUNTER — Ambulatory Visit: Payer: Medicaid Other | Admitting: Thoracic Surgery (Cardiothoracic Vascular Surgery)

## 2010-12-08 ENCOUNTER — Encounter: Payer: Self-pay | Admitting: Thoracic Surgery (Cardiothoracic Vascular Surgery)

## 2010-12-08 ENCOUNTER — Ambulatory Visit (INDEPENDENT_AMBULATORY_CARE_PROVIDER_SITE_OTHER): Payer: Medicaid Other | Admitting: Thoracic Surgery (Cardiothoracic Vascular Surgery)

## 2010-12-08 DIAGNOSIS — J984 Other disorders of lung: Secondary | ICD-10-CM

## 2010-12-08 DIAGNOSIS — IMO0001 Reserved for inherently not codable concepts without codable children: Secondary | ICD-10-CM

## 2010-12-08 DIAGNOSIS — R911 Solitary pulmonary nodule: Secondary | ICD-10-CM

## 2010-12-08 DIAGNOSIS — M797 Fibromyalgia: Secondary | ICD-10-CM

## 2010-12-08 DIAGNOSIS — F3130 Bipolar disorder, current episode depressed, mild or moderate severity, unspecified: Secondary | ICD-10-CM

## 2010-12-08 DIAGNOSIS — J438 Other emphysema: Secondary | ICD-10-CM

## 2010-12-08 DIAGNOSIS — F319 Bipolar disorder, unspecified: Secondary | ICD-10-CM

## 2010-12-08 DIAGNOSIS — J439 Emphysema, unspecified: Secondary | ICD-10-CM

## 2010-12-08 DIAGNOSIS — G40909 Epilepsy, unspecified, not intractable, without status epilepticus: Secondary | ICD-10-CM

## 2010-12-08 NOTE — Progress Notes (Signed)
45 yo woman history tobacco abuse, COPD, bipolar d/o, seizure d/o, ongoing tobacco abuse. Followed > 2 years for RLL nodule. Bronchoscopic biopsy = inflammation at [facility], cancelled multiple appointments and presented intoxicated on day of surgery. Released from that practice. I followed for > 1 year as nodule minimally changed and poor surgical candidate. PET showed low level uptake. After 2 years no significant change. Repeat CT 6 months later(5/12)- increase in size. We have tried since then to get her in for appointment.Finally came today.   States she has been doing poorly, unable to walk in stores, uses motorized scooter, can barely walk 1 flight of steps, could definitely not do 2 flights per her report. Frequent wheezing, productive cough, no hemoptysis. Still smoking. Says she is down to "3 cigarettes a day".  PE: vitals not done HEENT [place],AT, lips mildly cyanotic Neck- No adenopathy Lungs- Faint expiratory wheeze, minimal air movement Cardiac- RRR no murmur Abd- soft, NT Ext- dusky nail beds, early clubbing  PFTs [place]- unable to interpret  IMPRESSION:  45 yo female with ongoing tobacco abuse, severe COPD, and longstanding RLL nodule, which showed growth between 11/11 and 5/12. Concerning for possible low grade neoplasm.   In my opinion she is not a candidate for surgical resection, but could be a candidate for SBRT.   RECOMMENDATIONS:  I recommend Bronchoscopy with electromagnetic navigational guidance for diagnostic purposes.  I discussed with her the nature of the procedure, the need for general anesthesia and the risks. She understands it is diagnostic and not therapeutic. She understands risks include but are not limited to pneumothorax, bleeding, failure to wean from vent. She would need a CT with superdimension protocol and PFTs prior to procedure. She will d/w her husband and let us know what she wants to do.

## 2010-12-08 NOTE — Patient Instructions (Signed)
Quit smoking  Ct chest for electomagnetic navigational bronchoscopy in [facility]  Pulmonary function tests- in [place]

## 2010-12-26 LAB — URINALYSIS, ROUTINE W REFLEX MICROSCOPIC
Glucose, UA: NEGATIVE
Hgb urine dipstick: NEGATIVE
Nitrite: NEGATIVE
Protein, ur: 30 — AB
Specific Gravity, Urine: 1.025
Urobilinogen, UA: 0.2
pH: 7

## 2010-12-26 LAB — CBC
HCT: 38.3
MCHC: 34.1
MCV: 91.4
Platelets: 516 — ABNORMAL HIGH
RDW: 14.3
WBC: 16.5 — ABNORMAL HIGH

## 2010-12-26 LAB — COMPREHENSIVE METABOLIC PANEL
AST: 17
Albumin: 3.6
BUN: 5 — ABNORMAL LOW
Calcium: 10.4
Creatinine, Ser: 0.88
GFR calc Af Amer: >60
Total Protein: 7.8

## 2010-12-26 LAB — URINE MICROSCOPIC-ADD ON

## 2010-12-26 LAB — URINE CULTURE: Colony Count: 5000

## 2010-12-26 LAB — DIFFERENTIAL
Basophils Absolute: 0
Eosinophils Relative: 1
Lymphocytes Relative: 13
Lymphs Abs: 2.2
Monocytes Absolute: 1.3 — ABNORMAL HIGH
Neutro Abs: 12.7 — ABNORMAL HIGH

## 2010-12-26 LAB — RAPID URINE DRUG SCREEN, HOSP PERFORMED: Cocaine: NOT DETECTED

## 2010-12-26 LAB — PREGNANCY, URINE: Preg Test, Ur: NEGATIVE

## 2010-12-29 LAB — URINALYSIS, ROUTINE W REFLEX MICROSCOPIC
Glucose, UA: NEGATIVE
Ketones, ur: 15 — AB
Protein, ur: NEGATIVE
pH: 5.5

## 2010-12-29 LAB — URINE MICROSCOPIC-ADD ON

## 2010-12-29 LAB — POCT I-STAT, CHEM 8
BUN: 13
Calcium, Ion: 1.21
Creatinine, Ser: 0.8
Glucose, Bld: 97
Hemoglobin: 13.9
TCO2: 27

## 2010-12-29 LAB — DIFFERENTIAL
Eosinophils Absolute: 0.4
Eosinophils Relative: 3
Lymphocytes Relative: 35
Lymphs Abs: 3.8
Monocytes Absolute: 0.9

## 2010-12-29 LAB — CBC
HCT: 38.8
Hemoglobin: 13.4
MCV: 93.2
Platelets: 371
RDW: 13.6

## 2010-12-29 LAB — URINE CULTURE: Colony Count: >=100000

## 2011-01-04 ENCOUNTER — Other Ambulatory Visit: Payer: Self-pay | Admitting: Thoracic Surgery (Cardiothoracic Vascular Surgery)

## 2011-01-04 DIAGNOSIS — D381 Neoplasm of uncertain behavior of trachea, bronchus and lung: Secondary | ICD-10-CM

## 2011-01-05 ENCOUNTER — Ambulatory Visit (HOSPITAL_COMMUNITY)
Admission: RE | Admit: 2011-01-05 | Payer: Medicaid Other | Source: Ambulatory Visit | Admitting: Thoracic Surgery (Cardiothoracic Vascular Surgery)

## 2011-01-06 ENCOUNTER — Ambulatory Visit (HOSPITAL_COMMUNITY)
Admission: RE | Admit: 2011-01-06 | Discharge: 2011-01-06 | Disposition: A | Discharge status: Home / Self Care | Payer: Medicaid Other | Source: Ambulatory Visit | Attending: Thoracic Surgery (Cardiothoracic Vascular Surgery) | Admitting: Thoracic Surgery (Cardiothoracic Vascular Surgery)

## 2011-01-06 DIAGNOSIS — D381 Neoplasm of uncertain behavior of trachea, bronchus and lung: Secondary | ICD-10-CM

## 2011-01-06 DIAGNOSIS — R0602 Shortness of breath: Secondary | ICD-10-CM | POA: Insufficient documentation

## 2011-01-06 DIAGNOSIS — I2510 Atherosclerotic heart disease of native coronary artery without angina pectoris: Secondary | ICD-10-CM | POA: Insufficient documentation

## 2011-01-06 DIAGNOSIS — R911 Solitary pulmonary nodule: Secondary | ICD-10-CM | POA: Insufficient documentation

## 2011-01-06 DIAGNOSIS — I2584 Coronary atherosclerosis due to calcified coronary lesion: Secondary | ICD-10-CM | POA: Insufficient documentation

## 2011-01-09 LAB — BLOOD GAS, ARTERIAL
Acid-Base Excess: 3.7 mmol/L — ABNORMAL HIGH (ref 0.0–2.0)
Bicarbonate: 28 mEq/L — ABNORMAL HIGH (ref 20.0–24.0)
O2 Saturation: 94.7 %
Patient temperature: 98.6
TCO2: 29.4 mmol/L (ref 0–100)
pH, Arterial: 7.411 — ABNORMAL HIGH (ref 7.350–7.400)

## 2011-01-13 ENCOUNTER — Other Ambulatory Visit (HOSPITAL_COMMUNITY): Payer: Medicaid Other

## 2011-01-17 ENCOUNTER — Encounter (HOSPITAL_COMMUNITY)
Admission: RE | Admit: 2011-01-17 | Discharge: 2011-01-17 | Disposition: A | Discharge status: Home / Self Care | Payer: Medicaid Other | Source: Ambulatory Visit | Attending: Thoracic Surgery (Cardiothoracic Vascular Surgery) | Admitting: Thoracic Surgery (Cardiothoracic Vascular Surgery)

## 2011-01-17 ENCOUNTER — Other Ambulatory Visit: Payer: Self-pay | Admitting: Thoracic Surgery (Cardiothoracic Vascular Surgery)

## 2011-01-17 DIAGNOSIS — R911 Solitary pulmonary nodule: Secondary | ICD-10-CM

## 2011-01-17 LAB — CBC
HCT: 40.7 % (ref 36.0–46.0)
Hemoglobin: 13.5 g/dL (ref 12.0–15.0)
MCHC: 33.2 g/dL (ref 30.0–36.0)
MCV: 96.4 fL (ref 78.0–100.0)

## 2011-01-17 LAB — COMPREHENSIVE METABOLIC PANEL
ALT: 7 U/L (ref 0–35)
Albumin: 3.2 g/dL — ABNORMAL LOW (ref 3.5–5.2)
Alkaline Phosphatase: 70 U/L (ref 39–117)
Chloride: 102 mEq/L (ref 96–112)
GFR calc Af Amer: >90 mL/min (ref >90)
Glucose, Bld: 94 mg/dL (ref 70–99)
Potassium: 4.5 mEq/L (ref 3.5–5.1)
Sodium: 140 mEq/L (ref 135–145)
Total Bilirubin: 0.1 mg/dL — ABNORMAL LOW (ref 0.3–1.2)
Total Protein: 6.8 g/dL (ref 6.0–8.3)

## 2011-01-17 LAB — PROTIME-INR: INR: 0.88 (ref 0.00–1.49)

## 2011-01-19 ENCOUNTER — Other Ambulatory Visit: Payer: Self-pay | Admitting: Thoracic Surgery (Cardiothoracic Vascular Surgery)

## 2011-01-19 ENCOUNTER — Ambulatory Visit (HOSPITAL_COMMUNITY): Payer: Medicaid Other

## 2011-01-19 ENCOUNTER — Ambulatory Visit (HOSPITAL_COMMUNITY)
Admission: RE | Admit: 2011-01-19 | Discharge: 2011-01-19 | Disposition: A | Discharge status: Home / Self Care | Payer: Medicaid Other | Source: Ambulatory Visit | Attending: Thoracic Surgery (Cardiothoracic Vascular Surgery) | Admitting: Thoracic Surgery (Cardiothoracic Vascular Surgery)

## 2011-01-19 DIAGNOSIS — J4489 Other specified chronic obstructive pulmonary disease: Secondary | ICD-10-CM | POA: Insufficient documentation

## 2011-01-19 DIAGNOSIS — D381 Neoplasm of uncertain behavior of trachea, bronchus and lung: Secondary | ICD-10-CM

## 2011-01-19 DIAGNOSIS — Z01818 Encounter for other preprocedural examination: Secondary | ICD-10-CM | POA: Insufficient documentation

## 2011-01-19 DIAGNOSIS — I10 Essential (primary) hypertension: Secondary | ICD-10-CM | POA: Insufficient documentation

## 2011-01-19 DIAGNOSIS — R222 Localized swelling, mass and lump, trunk: Secondary | ICD-10-CM | POA: Insufficient documentation

## 2011-01-19 DIAGNOSIS — F39 Unspecified mood [affective] disorder: Secondary | ICD-10-CM | POA: Insufficient documentation

## 2011-01-19 DIAGNOSIS — Z01812 Encounter for preprocedural laboratory examination: Secondary | ICD-10-CM | POA: Insufficient documentation

## 2011-01-19 DIAGNOSIS — Z01810 Encounter for preprocedural cardiovascular examination: Secondary | ICD-10-CM | POA: Insufficient documentation

## 2011-01-19 DIAGNOSIS — J449 Chronic obstructive pulmonary disease, unspecified: Secondary | ICD-10-CM | POA: Insufficient documentation

## 2011-01-19 DIAGNOSIS — F17200 Nicotine dependence, unspecified, uncomplicated: Secondary | ICD-10-CM | POA: Insufficient documentation

## 2011-01-19 HISTORY — PX: OTHER SURGICAL HISTORY: SHX169

## 2011-01-20 NOTE — Op Note (Signed)
NAMETIAHNA, CURE               ACCOUNT NO.:  [id number]  MEDICAL RECORD NO.:  [id number]  LOCATION:  [facility]                         FACILITY:  [facility]  PHYSICIAN:  Salvatore Decent. Dorris Fetch, M.D.DATE OF BIRTH:  [birth year]  DATE OF PROCEDURE:  01/19/2011 DATE OF DISCHARGE:  01/19/2011                              OPERATIVE REPORT   PREOPERATIVE DIAGNOSIS:  Right lower lobe mass.  POSTOPERATIVE DIAGNOSIS:  Right lower lobe mass.  PROCEDURE:  Electromagnetic navigational bronchoscopy with fluoroscopy, with brushings, biopsies and bronchoalveolar lavage.  SURGEON:  Salvatore Decent. Dorris Fetch, MD  ASSISTANT:  Ines Bloomer, MD  ANESTHESIA:  General.  FINDINGS:  Multiple brushings, biopsies, and BAL performed.  Quick prep on initial brushings showed inflammatory cells.  No evidence of malignancy.  CLINICAL INDICATION:  Ms. Carlin is a 45 year old woman with a history of tobacco abuse and COPD.  She was found approximately 3 years ago to have a right lower lobe nodule.  Percutaneous biopsy revealed inflammatory cells.  She had been scheduled for surgery at that time, but presented intoxicated and the surgery was canceled and the patient was discharged from that practice.  We have been following this since that time sporadically due the patient's frequent failure to show up for appointments, but there has been evidence of recent increase in size and the patient was advised to undergo electromagnetic navigational bronchoscopy for biopsies to guide additional treatment decisions.  She is a poor operative candidate for surgical excision.  The indications, risks, benefits, and alternatives were discussed in detail with the patient.  She understood and accepted the risks and agreed to proceed.  OPERATIVE NOTE:  Ms. Mooty was brought to the operating room on January 19, 2011.  She had intravenous access established by the Anesthesia Service.  She was anesthetized and intubated.  Flexible  fiberoptic bronchoscopy was performed via the endotracheal tube.  There was normal endobronchial anatomy.  There was diffuse edema of the airways and bronchorrhea.  Secretions were clear.  There were no endobronchial lesions noted and normal endobronchial anatomy.  The guide catheter then was advanced through the bronchoscope and mapping of the bronchial tree was performed.  Electromagnetic navigational bronchoscopy then was used to approach the right lower lobe lesion after confirming localization. The guide was removed and a needle brush was placed and multiple passes were taken.  This was repeated and then the slides were sent for quick prep.  They subsequently returned with evidence of inflammation but no malignancy.  Additional brushings were performed.  The locating guide then was readvanced to once again confirm positioning.  The catheter was repositioned to a slightly different place, then was closer to the center of the lesion, and multiple biopsies were taken.  Additional brushings were performed and finally bronchoalveolar lavage was performed with 20 mL of saline.  The specimens were all sent for permanent pathology.  The bronchoscope was removed.  The patient subsequently was extubated in the operating room and taken to the postanesthetic care unit in good condition.     Salvatore Decent Dorris Fetch, M.D.     SCH/MEDQ  D:  01/19/2011  T:  01/19/2011  Job:  [id number]  Electronically Signed by Viviann Spare  Deonta Bomberger M.D. on 01/20/2011 03:07:09 PM

## 2011-01-24 ENCOUNTER — Telehealth: Payer: Self-pay | Admitting: Thoracic Surgery (Cardiothoracic Vascular Surgery)

## 2011-01-24 NOTE — Telephone Encounter (Signed)
Informed pt of path result from ENB- Carcinoid  She says she will keep appointment to see me next week to discuss treatment.

## 2011-01-27 ENCOUNTER — Encounter: Payer: Self-pay | Admitting: Thoracic Surgery (Cardiothoracic Vascular Surgery)

## 2011-01-27 DIAGNOSIS — R569 Unspecified convulsions: Secondary | ICD-10-CM | POA: Insufficient documentation

## 2011-01-27 DIAGNOSIS — J449 Chronic obstructive pulmonary disease, unspecified: Secondary | ICD-10-CM | POA: Insufficient documentation

## 2011-01-27 DIAGNOSIS — G8929 Other chronic pain: Secondary | ICD-10-CM | POA: Insufficient documentation

## 2011-01-27 DIAGNOSIS — M797 Fibromyalgia: Secondary | ICD-10-CM | POA: Insufficient documentation

## 2011-01-27 DIAGNOSIS — R918 Other nonspecific abnormal finding of lung field: Secondary | ICD-10-CM | POA: Insufficient documentation

## 2011-01-27 DIAGNOSIS — M549 Dorsalgia, unspecified: Secondary | ICD-10-CM

## 2011-02-02 ENCOUNTER — Ambulatory Visit (INDEPENDENT_AMBULATORY_CARE_PROVIDER_SITE_OTHER): Payer: Medicaid Other | Admitting: Thoracic Surgery (Cardiothoracic Vascular Surgery)

## 2011-02-02 ENCOUNTER — Encounter: Payer: Medicaid Other | Admitting: Thoracic Surgery (Cardiothoracic Vascular Surgery)

## 2011-02-02 ENCOUNTER — Encounter: Payer: Self-pay | Admitting: Thoracic Surgery (Cardiothoracic Vascular Surgery)

## 2011-02-02 VITALS — BP 133/93 | HR 109 | Resp 16 | Ht 62.0 in | Wt 158.0 lb

## 2011-02-02 DIAGNOSIS — J449 Chronic obstructive pulmonary disease, unspecified: Secondary | ICD-10-CM

## 2011-02-02 DIAGNOSIS — F17200 Nicotine dependence, unspecified, uncomplicated: Secondary | ICD-10-CM

## 2011-02-02 DIAGNOSIS — D3A090 Benign carcinoid tumor of the bronchus and lung: Secondary | ICD-10-CM

## 2011-02-02 MED ORDER — BUPROPION HCL ER (SR) 150 MG PO TB12: 150.0000 mg | ORAL_TABLET | Freq: Two times a day (BID) | ORAL | Status: AC

## 2011-02-02 NOTE — Progress Notes (Signed)
PCP is Regino Bellow, MD, MD Referring Provider is Regino Bellow, MD  Chief Complaint  Patient presents with  . Routine Post Op    s/p enb 01/19/11    HPI: Ms. Farrell Ours is a 45 year old woman who has been followed for about 3 years now with a right lower lobe nodule. This followed up have been initiated [place] before the doctors there released her from their practice. 4 long period of time the nodule showed little to no growth in the previous biopsy had shown inflammatory changes but no tumor. More recently there had been some growth of the nodule, however due to missed appointments were unable to get her scheduled for biopsy for about 6 months. We did electromagnetic navigational bronchoscopy 2 weeks ago and that was positive for carcinoid tumor (low-grade neuroendocrine). I did inform her of this diagnosis by telephone. She now returns today to further discuss our options.  She states that she's been feeling poorly she gets short of breath with walking short distances on level ground, and she cannot make it up 2 flights of stairs. She says that Spiriva is not helping, she previously been on nebulizers but does not currently have those. She continues to smoke approximately 1/2 of a pack of cigarettes a day. She also is upset about issues relating to her disability claims, and says that anxiety from that is causing her to crave cigarettes.  She expresses doubts about her ability to quit smoking.  No past medical history on file.  Past Surgical History  Procedure Date  . Appendectomy   . Cesarean section   . Oophorectomy   . Enb  with fluoroscopy,  with brushings, biopsies and bronchoalveolar lavage. 01/19/11    DR Kaamil Morefield    No family history on file.  Social History History  Substance Use Topics  . Smoking status: Current Everyday Smoker -- 0.3 packs/day for 30 years    Types: Cigarettes  . Smokeless tobacco: Not on file   Comment: Previous heavy use  . Alcohol Use: No     Current Outpatient Prescriptions  Medication Sig Dispense Refill  . albuterol (PROVENTIL HFA;VENTOLIN HFA) 108 (90 BASE) MCG/ACT inhaler Inhale 2 puffs into the lungs every 6 (six) hours as needed.        . carisoprodol (SOMA) 350 MG tablet Take 350 mg by mouth 3 (three) times daily as needed.        . clonazePAM (KLONOPIN) 1 MG tablet Take 1 mg by mouth 2 (two) times daily as needed.        . divalproex (DEPAKOTE) 500 MG 24 hr tablet Take 2,500 mg by mouth at bedtime.        . furosemide (LASIX) 20 MG tablet Take 20 mg by mouth daily.        Marland Kitchen HYDROcodone-acetaminophen (VICODIN) 5-500 MG per tablet Take 1 tablet by mouth every 6 (six) hours as needed.        Marland Kitchen lisinopril-hydrochlorothiazide (PRINZIDE,ZESTORETIC) 20-25 MG per tablet Take 1 tablet by mouth daily.        . QUEtiapine (SEROQUEL XR) 300 MG 24 hr tablet Take 600 mg by mouth at bedtime.        Marland Kitchen tiotropium (SPIRIVA) 18 MCG inhalation capsule Place 18 mcg into inhaler and inhale daily.        . Amlodipine-Olmesartan (AZOR PO) Take by mouth.          Allergies  Allergen Reactions  . Nsaids Other (See Comments)    "STEROIDS"  AGITATED W/ LONG TERM USE.  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  I had a long discussion with Mrs. Whirley about carcinoid tumors, their origin and natural history and treatment. She understands that surgery is the preferred treatment. She does understand that her tumor in particular has a very benign fashion up to  this point, but did show more significant growth from May to October of this year and it had previously. This could be a sign tumors getting more aggressive. Fortunately there had been no sign of any spread so far.  She does have a peripheral lesion which would be amenable to a wedge resection with minimal compromise of her pulmonary function. She does understand that this is still a serious operation with potential for complications including but not limited to death, MI, DVT, PE, bleeding, possible need for transfusion, wound infection, pneumonia or bronchitis, or other unforeseeable complications.  I had a very frank discussion with her regarding the need for smoking cessation prior surgery to decrease her risk for pulmonary-related complications. She would need to quit smoking for at least 2 weeks prior surgery, and preferably for a month. She understands that presently her continued smoking that her pulmonary function is severely impaired and could lead to serious complications after surgery. She expresses doubts as to her ability to quit smoking. I spent a long time counseling her about this. Not only in relation to her current situation but also in relation to her COPD and quality-of-life as well as life expectancy if she were to continue smoking.  She does expresses an interest in trying to quit smoking, although she doesn't appear to be particularly enthusiastic or optimistic about it. We will give her a prescription for Wellbutrin SR 150 mg by mouth daily for 3 days, then 150 mg twice daily. I told her to set the quit date a week after starting Wellbutrin.  Plan: Wellbutrin SR 150 mg by mouth twice a day Quit smoking 7 days after starting Wellbutrin I will see her back in one month to check on her progress smoking cessation. If she has stopped smoking we can set a date for surgical resection of carcinoid tumor.

## 2011-03-01 ENCOUNTER — Encounter: Payer: Medicaid Other | Admitting: Thoracic Surgery (Cardiothoracic Vascular Surgery)

## 2011-03-15 ENCOUNTER — Encounter: Payer: Medicaid Other | Admitting: Thoracic Surgery (Cardiothoracic Vascular Surgery)

## 2011-03-22 ENCOUNTER — Encounter: Payer: Medicaid Other | Admitting: Thoracic Surgery (Cardiothoracic Vascular Surgery)

## 2011-04-17 ENCOUNTER — Encounter: Payer: Medicaid Other | Admitting: Thoracic Surgery (Cardiothoracic Vascular Surgery)

## 2011-04-17 NOTE — Progress Notes (Signed)
This encounter was created in error - please disregard.  This encounter was created in error - please disregard.

## 2011-04-26 DIAGNOSIS — Z0279 Encounter for issue of other medical certificate: Secondary | ICD-10-CM

## 2011-05-03 ENCOUNTER — Ambulatory Visit (INDEPENDENT_AMBULATORY_CARE_PROVIDER_SITE_OTHER): Payer: Medicaid Other | Admitting: Thoracic Surgery (Cardiothoracic Vascular Surgery)

## 2011-05-03 DIAGNOSIS — R911 Solitary pulmonary nodule: Secondary | ICD-10-CM

## 2011-05-03 DIAGNOSIS — J984 Other disorders of lung: Secondary | ICD-10-CM
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
## 2011-05-09 DIAGNOSIS — D381 Neoplasm of uncertain behavior of trachea, bronchus and lung: Secondary | ICD-10-CM

## 2011-05-25 ENCOUNTER — Other Ambulatory Visit: Payer: Self-pay | Admitting: Thoracic Surgery (Cardiothoracic Vascular Surgery)

## 2011-05-25 DIAGNOSIS — D381 Neoplasm of uncertain behavior of trachea, bronchus and lung: Secondary | ICD-10-CM

## 2011-05-29 ENCOUNTER — Other Ambulatory Visit: Payer: Self-pay | Admitting: *Deleted

## 2011-05-29 DIAGNOSIS — G8918 Other acute postprocedural pain: Secondary | ICD-10-CM

## 2011-05-29 MED ORDER — HYDROCODONE-ACETAMINOPHEN 7.5-500 MG PO TABS: 1.0000 | ORAL_TABLET | Freq: Four times a day (QID) | ORAL | Status: AC | PRN

## 2011-05-30 ENCOUNTER — Encounter: Payer: Self-pay | Admitting: Thoracic Surgery (Cardiothoracic Vascular Surgery)

## 2011-06-07 ENCOUNTER — Other Ambulatory Visit: Payer: Self-pay | Admitting: Thoracic Surgery (Cardiothoracic Vascular Surgery)

## 2011-06-07 DIAGNOSIS — D381 Neoplasm of uncertain behavior of trachea, bronchus and lung: Secondary | ICD-10-CM

## 2011-06-13 ENCOUNTER — Ambulatory Visit (INDEPENDENT_AMBULATORY_CARE_PROVIDER_SITE_OTHER): Payer: Self-pay | Admitting: Thoracic Surgery (Cardiothoracic Vascular Surgery)

## 2011-06-13 ENCOUNTER — Encounter: Payer: Self-pay | Admitting: Thoracic Surgery (Cardiothoracic Vascular Surgery)

## 2011-06-13 ENCOUNTER — Ambulatory Visit
Admission: RE | Admit: 2011-06-13 | Discharge: 2011-06-13 | Disposition: A | Discharge status: Home / Self Care | Payer: Medicaid Other | Source: Ambulatory Visit | Attending: Thoracic Surgery (Cardiothoracic Vascular Surgery) | Admitting: Thoracic Surgery (Cardiothoracic Vascular Surgery)

## 2011-06-13 VITALS — BP 144/92 | HR 90 | Temp 97.0°F | Resp 20 | Ht 62.0 in | Wt 162.0 lb

## 2011-06-13 DIAGNOSIS — D3A090 Benign carcinoid tumor of the bronchus and lung: Secondary | ICD-10-CM | POA: Insufficient documentation

## 2011-06-13 DIAGNOSIS — Z9889 Other specified postprocedural states: Secondary | ICD-10-CM

## 2011-06-13 DIAGNOSIS — D381 Neoplasm of uncertain behavior of trachea, bronchus and lung: Secondary | ICD-10-CM

## 2011-06-13 DIAGNOSIS — R911 Solitary pulmonary nodule: Secondary | ICD-10-CM

## 2011-06-13 NOTE — Progress Notes (Signed)
  HPI:  [name] returns today for a scheduled followup visit. She is a 46 year old woman who been followed for right lower lobe nodule for about 3 years. 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 She had a wedge resection of the lesion at [facility] on 05/09/2011. Pathology revealed 2 cm area of consolidation and chronic inflammation and rare scattered carcinoid tumorlets.  She states that she still gets short of breath at times, which is her baseline. This has really changed since his surgery. She does have some incisional discomfort and feels some paresthesias in her upper right upper quadrant.  Current Outpatient Prescriptions  Medication Sig Dispense Refill  . albuterol (PROVENTIL HFA;VENTOLIN HFA) 108 (90 BASE) MCG/ACT inhaler Inhale 2 puffs into the lungs every 6 (six) hours as needed.        Marland Kitchen buPROPion (WELLBUTRIN SR) 150 MG 12 hr tablet Take 1 tablet (150 mg total) by mouth 2 (two) times daily.  60 tablet  2  . carisoprodol (SOMA) 350 MG tablet Take 350 mg by mouth 3 (three) times daily as needed.        . clonazePAM (KLONOPIN) 1 MG tablet Take 1 mg by mouth 2 (two) times daily as needed.        . divalproex (DEPAKOTE) 500 MG 24 hr tablet Take 2,500 mg by mouth at bedtime.        . furosemide (LASIX) 20 MG tablet Take 20 mg by mouth as needed.       Marland Kitchen lisinopril-hydrochlorothiazide (PRINZIDE,ZESTORETIC) 20-25 MG per tablet Take 1 tablet by mouth daily.        . QUEtiapine (SEROQUEL XR) 300 MG 24 hr tablet Take 600 mg by mouth at bedtime.        Marland Kitchen tiotropium (SPIRIVA) 18 MCG inhalation capsule Place 18 mcg into inhaler and inhale daily.          Physical Exam BP 144/92  Pulse 90  Temp(Src) 97 F (36.1 C) (Oral)  Resp 20  Ht 5\' 2"  (1.575 m)  Wt 162 lb (73.483 kg)  BMI 29.63 kg/m2  SpO2 96% Lungs clear Incisions healing well  Diagnostic Tests: Chest x-ray shows postoperative changes no new lesions  seen  Impression: Mrs. 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 The lesion was removed with a wedge resection. The final pathology showed that the primary mass was a 2 cm inflammatory mass with rare carcinoid tumorlets present in the specimen.  Plan: Amanda Clark is doing well at this point in time. She still have some postoperative discomfort, which is not unexpected. I did give her a prescription for Lortab 7.5/500 one tablet by mouth twice daily as needed for pain, 40 tablets, no refills. At this point her activities are unrestricted, but I did caution her to build a new activities gradually to assess the level of discomfort.  Given that she has carcinoid "tumorlets" I do think she needs to be followed rule out development of a carcinoid tumor further down the road. I will plan to see her back in one year with a low dose CT of the chest. After that we can likely scan her less frequently.

## 2011-06-26 ENCOUNTER — Other Ambulatory Visit: Payer: Self-pay

## 2011-06-26 DIAGNOSIS — G8918 Other acute postprocedural pain: Secondary | ICD-10-CM

## 2011-06-26 MED ORDER — HYDROCODONE-ACETAMINOPHEN 7.5-500 MG PO TABS: 1.0000 | ORAL_TABLET | Freq: Three times a day (TID) | ORAL | Status: AC | PRN

## 2011-06-26 NOTE — Telephone Encounter (Signed)
RX for Lortab 7.5/500 mg #40/0 refill called into Pharm

## 2012-05-13 ENCOUNTER — Other Ambulatory Visit: Payer: Self-pay

## 2012-05-13 DIAGNOSIS — D381 Neoplasm of uncertain behavior of trachea, bronchus and lung: Secondary | ICD-10-CM

## 2012-06-25 ENCOUNTER — Other Ambulatory Visit: Payer: Medicaid Other

## 2012-06-25 ENCOUNTER — Ambulatory Visit: Payer: Medicaid Other | Admitting: Thoracic Surgery (Cardiothoracic Vascular Surgery)

## 2012-08-08 DIAGNOSIS — Z0279 Encounter for issue of other medical certificate: Secondary | ICD-10-CM

## 2013-05-07 IMAGING — CR DG CHEST 1V PORT
1 series · 1 of 1 positions shown · non-contrast
Comparison: 01/17/2011

CLINICAL DATA: Post bronchoscopy.  Rule out pneumothorax

PORTABLE CHEST - 1 VIEW

[view not recorded]
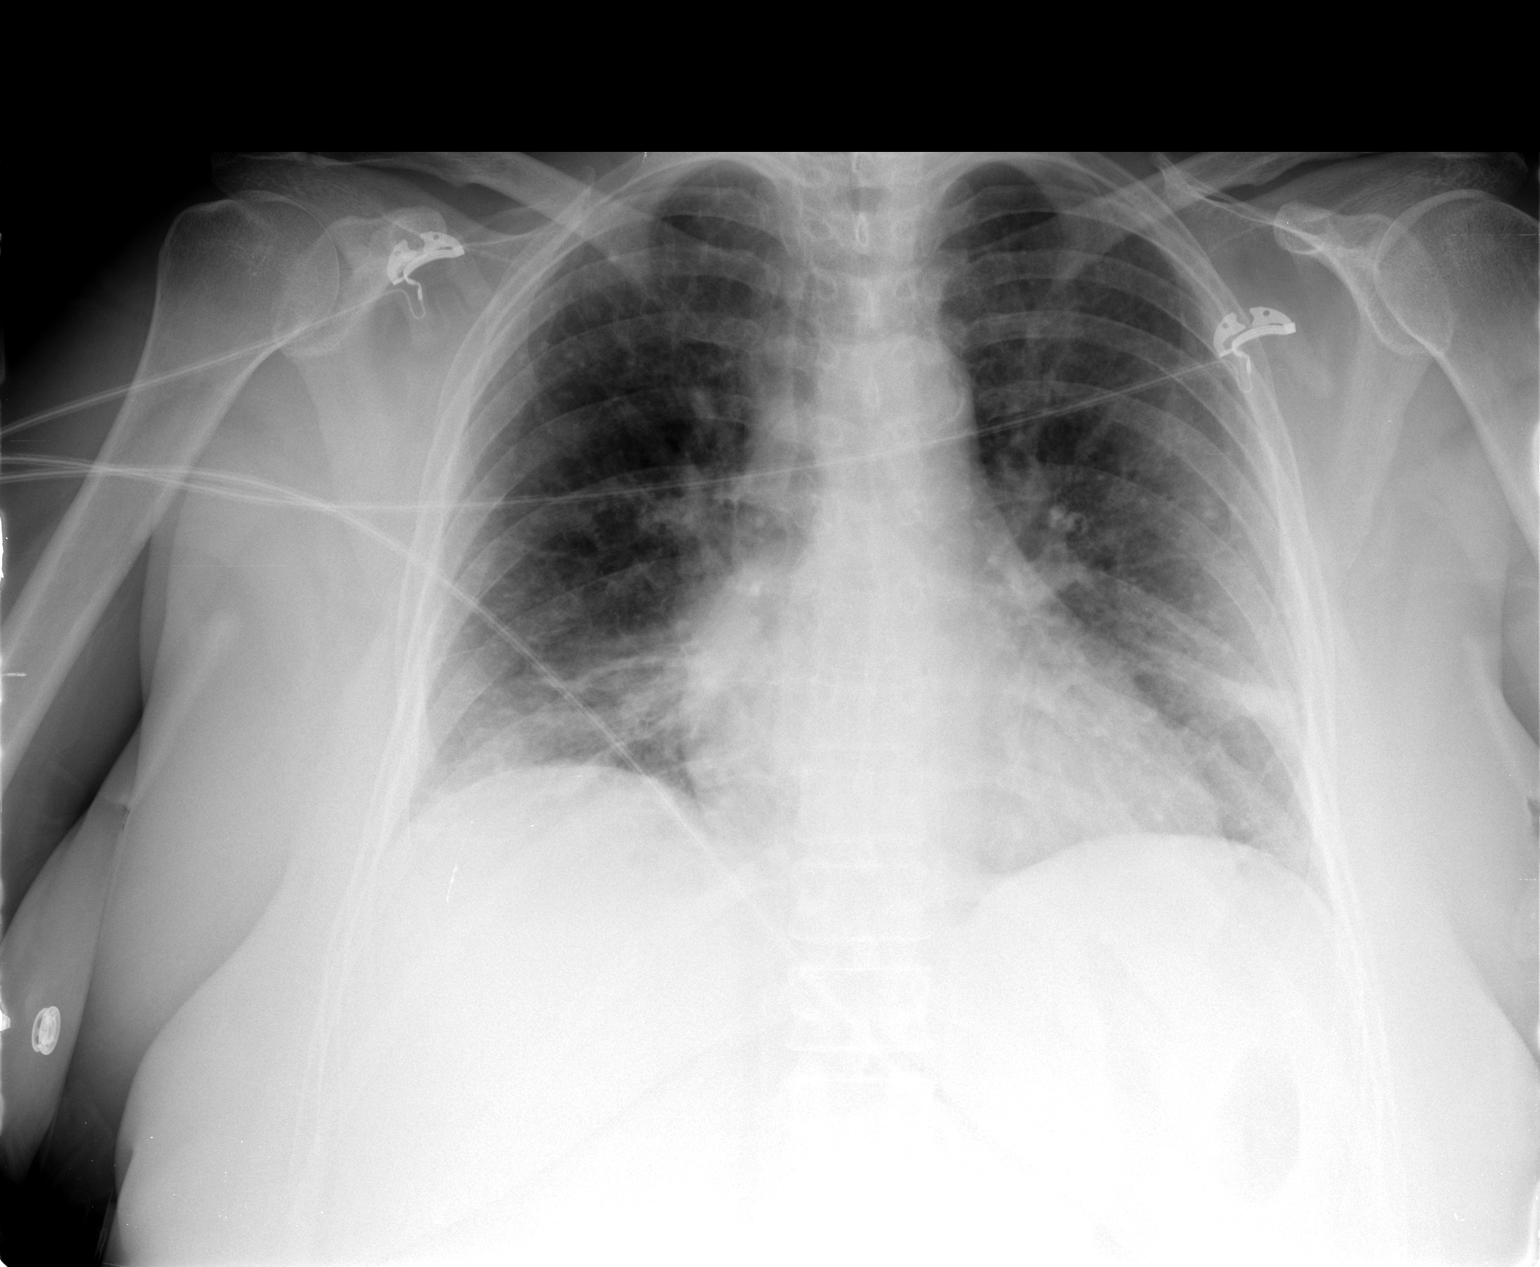

[1 of 1 positions shown; findings below may reference images not displayed]

FINDINGS: Decreased lung volumes with bibasilar atelectasis which
has increased from the prior study.  Negative for heart failure or
effusion.

Negative for pneumothorax.
IMPRESSION: Negative for pneumothorax.

Hypoventilation.

## 2017-02-01 DEATH — deceased

## 2017-03-20 ENCOUNTER — Other Ambulatory Visit: Payer: Self-pay

## 2017-03-20 ENCOUNTER — Telehealth: Payer: Self-pay

## 2017-03-20 MED ORDER — ALBUTEROL SULFATE HFA 108 (90 BASE) MCG/ACT IN AERS: 2.0000 | INHALATION_SPRAY | RESPIRATORY_TRACT | 5 refills | Status: AC | PRN

## 2017-03-20 MED ORDER — MOMETASONE FUROATE 50 MCG/ACT NA SUSP: 2.0000 | Freq: Every day | NASAL | 5 refills | Status: AC

## 2017-03-20 NOTE — Telephone Encounter (Signed)
Rx change requested from [facility]. Per Dr. Raliegh Ip, script sent for Proventil and Nasonex as perferred meds.

## 2017-05-29 ENCOUNTER — Other Ambulatory Visit: Payer: Self-pay
